# Patient Record
Sex: Female | Born: 2000 | Race: White | Hispanic: No | Marital: Single
Health system: Southern US, Community
[De-identification: ages and names within clinical notes are randomized; demographics above are authoritative.]

---
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
## 2024-05-07 ENCOUNTER — Ambulatory Visit: Admitting: Dermatology

## 2024-05-21 ENCOUNTER — Encounter: Payer: Self-pay | Admitting: Dermatology

## 2024-05-21 ENCOUNTER — Ambulatory Visit: Payer: PRIVATE HEALTH INSURANCE | Admitting: Dermatology

## 2024-05-21 DIAGNOSIS — L700 Acne vulgaris: Secondary | ICD-10-CM

## 2024-05-21 DIAGNOSIS — Z79899 Other long term (current) drug therapy: Secondary | ICD-10-CM

## 2024-05-21 NOTE — Patient Instructions (Addendum)
 Contact [facility] at [phone] to transfer care to Dr. Claudene so we can enroll you in [facility].  Due to recent changes in healthcare laws, you may see results of your pathology and/or laboratory studies on MyChart before the doctors have had a chance to review them. We understand that in some cases there may be results that are confusing or concerning to you. Please understand that not all results are received at the same time and often the doctors may need to interpret multiple results in order to provide you with the best plan of care or course of treatment. Therefore, we ask that you please give us  2 business days to thoroughly review all your results before contacting the office for clarification. Should we see a critical lab result, you will be contacted sooner.   If You Need Anything After Your Visit  If you have any questions or concerns for your doctor, please call our main line at [phone] and press option 4 to reach your doctor's medical assistant. If no one answers, please leave a voicemail as directed and we will return your call as soon as possible. Messages left after 4 pm will be answered the following business day.   You may also send us  a message via MyChart. We typically respond to MyChart messages within 1-2 business days.  For prescription refills, please ask your pharmacy to contact our office. Our fax number is [phone].  If you have an urgent issue when the clinic is closed that cannot wait until the next business day, you can page your doctor at the number below.    Please note that while we do our best to be available for urgent issues outside of office hours, we are not available 24/7.   If you have an urgent issue and are unable to reach us , you may choose to seek medical care at your doctor's office, retail clinic, urgent care center, or emergency room.  If you have a medical emergency, please immediately call 911 or go to the emergency department.  Pager  Numbers  - Dr. Hester: [phone]  - Dr. Jackquline: [phone]  - Dr. Claudene: [phone]   - Dr. Raymund: [phone]  In the event of inclement weather, please call our main line at [phone] for an update on the status of any delays or closures.  Dermatology Medication Tips: Please keep the boxes that topical medications come in in order to help keep track of the instructions about where and how to use these. Pharmacies typically print the medication instructions only on the boxes and not directly on the medication tubes.   If your medication is too expensive, please contact our office at [phone] option 4 or send us  a message through MyChart.   We are unable to tell what your co-pay for medications will be in advance as this is different depending on your insurance coverage. However, we may be able to find a substitute medication at lower cost or fill out paperwork to get insurance to cover a needed medication.   If a prior authorization is required to get your medication covered by your insurance company, please allow us  1-2 business days to complete this process.  Drug prices often vary depending on where the prescription is filled and some pharmacies may offer cheaper prices.  The website www.goodrx.com contains coupons for medications through different pharmacies. The prices here do not account for what the cost may be with help from insurance (it may be cheaper with your insurance), but the website  can give you the price if you did not use any insurance.  - You can print the associated coupon and take it with your prescription to the pharmacy.  - You may also stop by our office during regular business hours and pick up a GoodRx coupon card.  - If you need your prescription sent electronically to a different pharmacy, notify our office through [facility] or by phone at [phone] option 4.     Si Usted Necesita Algo Despus de Su Visita  Tambin puede  enviarnos un mensaje a travs de Clinical cytogeneticist. Por lo general respondemos a los mensajes de MyChart en el transcurso de 1 a 2 das hbiles.  Para renovar recetas, por favor pida a su farmacia que se ponga en contacto con nuestra oficina. Randi lakes de fax es [place] [phone].  Si tiene un asunto urgente cuando la clnica est cerrada y que no puede esperar hasta el siguiente da hbil, puede llamar/localizar a su doctor(a) al nmero que aparece a continuacin.   Por favor, tenga en cuenta que aunque hacemos todo lo posible para estar disponibles para asuntos urgentes fuera del horario de [place], no estamos disponibles las 24 horas del da, los 7 [address]  [address] de la [place].   Si tiene un problema urgente y no puede comunicarse con nosotros, puede optar por buscar atencin mdica  en el consultorio de su doctor(a), en una clnica privada, en un centro de atencin urgente o en una sala de emergencias.  Si tiene Engineer, drilling, por favor llame inmediatamente al 911 o vaya a la sala de emergencias.  Nmeros de bper  - Dr. Hester: [phone]  - Dra. Jackquline: [phone]  - Dr. Claudene: [phone]  - Dra. Kitts: [phone]  En caso de inclemencias del [employer], por favor llame a nuestra lnea principal al [phone] para una actualizacin sobre el estado de cualquier retraso o cierre.  Consejos para la medicacin en dermatologa: Por favor, guarde las cajas en las que vienen los medicamentos de uso tpico para ayudarle a seguir las instrucciones sobre dnde y cmo usarlos. Las farmacias generalmente imprimen las instrucciones del medicamento slo en las cajas y no directamente en los tubos del [place].   Si su medicamento es muy caro, por favor, pngase en contacto con landry rieger llamando al [phone] y presione la opcin 4 o envenos un mensaje a travs de Clinical cytogeneticist.   No podemos decirle cul ser su copago por los medicamentos por adelantado ya que esto es diferente dependiendo  de la cobertura de su seguro. Sin embargo, es posible que podamos encontrar un medicamento sustituto a Audiological scientist un formulario para que el seguro cubra el medicamento que se considera necesario.   Si se requiere una autorizacin previa para que su compaa de seguros malta su medicamento, por favor permtanos de 1 a 2 das hbiles para completar este proceso.  Los precios de los medicamentos varan con frecuencia dependiendo del Environmental consultant de dnde se surte la receta y alguna farmacias pueden ofrecer precios ms baratos.  El sitio web www.goodrx.com tiene cupones para medicamentos de Health and safety inspector. Los precios aqu no tienen en cuenta lo que podra costar con la ayuda del seguro (puede ser ms barato con su seguro), pero el sitio web puede darle el precio si no utiliz Tourist information centre manager.  - Puede imprimir el cupn correspondiente y llevarlo con su receta a la farmacia.  - Tambin puede pasar por nuestra oficina durante el horario de atencin regular y Librarian, academic tarjeta  de cupones de GoodRx.  - Si necesita que su receta se enve electrnicamente a una farmacia diferente, informe a nuestra oficina a travs de MyChart de [place] o por telfono llamando al [phone] y presione la opcin 4.

## 2024-05-21 NOTE — Progress Notes (Signed)
   New Patient Visit   Subjective  Chelsey Jenkins is a 23 y.o. female who presents for the following: Acne Vulgaris - pt was on Isotretinoin 2020 for 9 months, which cleared acne until 2022-2023, pt didn't have significant side effects other than dryness. Pt currently using Aklief lotion QHS and Winlevi cream BID, pt has been using for 4-5 mths. She was prescribed Minocycline, but it caused chronic headaches so she stopped using it. Doxycycline made pt extremely nauseous and she was unable to tolerated it.  The following portions of the chart were reviewed this encounter and updated as appropriate: medications, allergies, medical history  Review of Systems:  No other skin or systemic complaints except as noted in HPI or Assessment and Plan.  Objective  Well appearing patient in no apparent distress; mood and affect are within normal limits.  Areas Examined: Face, chest and back  Relevant exam findings are noted in the Assessment and Plan.   Assessment & Plan         ACNE VULGARIS   LONG-TERM USE OF HIGH-RISK MEDICATION   ACNE VULGARIS with scarring - S/P one course of Isotretinoin in 2020  Exam: scattered inflammatory papules on cheeks > chin > L shoulder. Pitted scars on cheeks temples glabella  Chronic and persistent condition with duration or expected duration over one year. Condition is bothersome/symptomatic for patient. Currently flared.  Treatment Plan: Discussed and recommend restarting isotretinoin since patient has inflammatory scarring acne. Pt states that she recently had labs drawn from her PCP and she will send those results to our office.   Pt currently on OBC and will plan to use condoms while on treatment.   In office urine pregnancy test negative today. Pt registered under another provider and was unable to login to [facility] today to transfer care. She will contact our office once she has transferred care to me so we can register her in  [facility].  Continue Aklief nightly and Winlevi BID. Deferring doxy given side effects  Discussed isotretinoin and potential side effects. Isotretinoin Counseling; Review and Contraception Counseling: Reviewed potential side effects of isotretinoin including xerosis, cheilitis, hepatitis, hyperlipidemia, and severe birth defects if taken by a pregnant woman.  Women on isotretinoin must be celibate (not having sex) or required to use at least 2 birth control methods to prevent pregnancy (unless patient is a female of non-child bearing potential).  Females of child-bearing potential must have monthly pregnancy tests while on isotretinoin and report through I-Pledge (FDA monitoring program). Reviewed reports of suicidal ideation in those with a history of depression while taking isotretinoin and reports of diagnosis of inflammatory bowl disease (IBD) while taking isotretinoin as well as the lack of evidence for a causal relationship between isotretinoin, depression and IBD. Patient advised to reach out with any questions or concerns. Patient advised not to share pills or donate blood while on treatment or for one month after completing treatment. All patient's considering Isotretinoin must read and understand and sign Isotretinoin Consent Form and be registered with I-Pledge.  Pt must be off of Isotretinoin for one year and face should be completely clear before starting treatment for scarring.  Return in about 1 month (around 06/21/2024) for Isotretinoin start.  LILLETTE Chelsey Jenkins, CMA, am acting as scribe for Chelsey Sharps, MD .   Documentation: I have reviewed the above documentation for accuracy and completeness, and I agree with the above.  Chelsey Sharps, MD

## 2024-05-22 ENCOUNTER — Encounter: Payer: Self-pay | Admitting: Dermatology

## 2024-07-21 ENCOUNTER — Other Ambulatory Visit: Payer: Self-pay

## 2024-07-21 ENCOUNTER — Emergency Department: Payer: PRIVATE HEALTH INSURANCE

## 2024-07-21 ENCOUNTER — Emergency Department
Admission: EM | Admit: 2024-07-21 | Discharge: 2024-07-21 | Disposition: A | Discharge status: Home / Self Care | Payer: PRIVATE HEALTH INSURANCE

## 2024-07-21 DIAGNOSIS — J4521 Mild intermittent asthma with (acute) exacerbation: Secondary | ICD-10-CM | POA: Insufficient documentation

## 2024-07-21 DIAGNOSIS — R079 Chest pain, unspecified: Secondary | ICD-10-CM | POA: Diagnosis present

## 2024-07-21 DIAGNOSIS — D72829 Elevated white blood cell count, unspecified: Secondary | ICD-10-CM | POA: Diagnosis not present

## 2024-07-21 LAB — BASIC METABOLIC PANEL WITH GFR
Anion gap: 10 (ref 5–15)
BUN: 13 mg/dL (ref 6–20)
CO2: 23 mmol/L (ref 22–32)
Calcium: 9.2 mg/dL (ref 8.9–10.3)
Chloride: 103 mmol/L (ref 98–111)
Creatinine, Ser: 0.8 mg/dL (ref 0.44–1.00)
GFR, Estimated: >60 mL/min (ref >60)
Glucose, Bld: 107 mg/dL — ABNORMAL HIGH (ref 70–99)
Potassium: 3.7 mmol/L (ref 3.5–5.1)
Sodium: 136 mmol/L (ref 135–145)

## 2024-07-21 LAB — CBC
HCT: 39.3 % (ref 36.0–46.0)
Hemoglobin: 13.3 g/dL (ref 12.0–15.0)
MCH: 29.8 pg (ref 26.0–34.0)
MCHC: 33.8 g/dL (ref 30.0–36.0)
MCV: 87.9 fL (ref 80.0–100.0)
Platelets: 286 K/uL (ref 150–400)
RBC: 4.47 MIL/uL (ref 3.87–5.11)
RDW: 12.2 % (ref 11.5–15.5)
WBC: 13.9 K/uL — ABNORMAL HIGH (ref 4.0–10.5)
nRBC: 0 % (ref 0.0–0.2)

## 2024-07-21 LAB — RESP PANEL BY RT-PCR (RSV, FLU A&B, COVID)  RVPGX2
Influenza A by PCR: NEGATIVE
Influenza B by PCR: NEGATIVE
Resp Syncytial Virus by PCR: NEGATIVE
SARS Coronavirus 2 by RT PCR: NEGATIVE

## 2024-07-21 LAB — POC URINE PREG, ED: Preg Test, Ur: NEGATIVE

## 2024-07-21 LAB — D-DIMER, QUANTITATIVE: D-Dimer, Quant: 0.42 ug{FEU}/mL (ref 0.00–0.50)

## 2024-07-21 LAB — TROPONIN I (HIGH SENSITIVITY): Troponin I (High Sensitivity): <2 ng/L (ref <18)

## 2024-07-21 MED ORDER — PREDNISONE 20 MG PO TABS: 20.0000 mg | ORAL_TABLET | Freq: Two times a day (BID) | ORAL | 0 refills | Status: DC

## 2024-07-21 MED ORDER — SODIUM CHLORIDE 0.9 % IV BOLUS
1000.0000 mL | Freq: Once | INTRAVENOUS | Status: AC
  Administered 2024-07-21: 1000 mL via INTRAVENOUS

## 2024-07-21 MED ORDER — IPRATROPIUM-ALBUTEROL 0.5-2.5 (3) MG/3ML IN SOLN
3.0000 mL | Freq: Once | RESPIRATORY_TRACT | Status: AC
  Administered 2024-07-21: 3 mL via RESPIRATORY_TRACT
  Filled 2024-07-21: qty 3

## 2024-07-21 MED ORDER — PREDNISONE 20 MG PO TABS
60.0000 mg | ORAL_TABLET | Freq: Once | ORAL | Status: AC
  Administered 2024-07-21: 60 mg via ORAL
  Filled 2024-07-21: qty 3

## 2024-07-21 MED ORDER — PREDNISONE 20 MG PO TABS: 20.0000 mg | ORAL_TABLET | Freq: Two times a day (BID) | ORAL | 0 refills | Status: AC

## 2024-07-21 NOTE — Discharge Instructions (Signed)
 For the next 24 hours use your inhaler or breathing treatments every 4-6 hours while awake.  Being in a humidified environment will help.  Your next dose of steroids will be due tomorrow morning.  Please return with any acutely worsening symptoms or any other emergency. -- RETURN PRECAUTIONS & AFTERCARE: (ENGLISH) RETURN PRECAUTIONS: Return immediately to the emergency department or see/call your doctor if you feel worse, weak or have changes in speech or vision, are short of breath, have fever, vomiting, pain, bleeding or dark stool, trouble urinating or any new issues. Return here or see/call your doctor if not improving as expected for your suspected condition. FOLLOW-UP CARE: Call your doctor and/or any doctors we referred you to for more advice and to make an appointment. Do this today, tomorrow or after the weekend. Some doctors only take PPO insurance so if you have HMO insurance you may want to contact your HMO or your regular doctor for referral to a specialist within your plan. Either way tell the doctor's office that it was a referral from the emergency department so you get the soonest possible appointment.  YOUR TEST RESULTS: Take result reports of any blood or urine tests, imaging tests and EKG's to your doctor and any referral doctor. Have any abnormal tests repeated. Your doctor or a referral doctor can let you know when this should be done. Also make sure your doctor contacts this hospital to get any test results that are not currently available such as cultures or special tests for infection and final imaging reports, which are often not available at the time you leave the ER but which may list additional important findings that are not documented on the preliminary report. BLOOD PRESSURE: If your blood pressure was greater than 120/80 have your blood pressure rechecked within 1 to 2 weeks. MEDICATION SIDE EFFECTS: Do not drive, walk, bike, take the bus, etc. if you have received or are  being prescribed any sedating medications such as those for pain or anxiety or certain antihistamines like Benadryl. If you have been give one of these here get a taxi home or have a friend drive you home. Ask your pharmacist to counsel you on potential side effects of any new medication

## 2024-07-21 NOTE — ED Provider Notes (Signed)
 [facility] Provider Note    Event Date/Time   First MD Initiated Contact with Patient 07/21/24 1851     (approximate)   History   Chest Pain   HPI  Chelsey Jenkins is a 23 y.o. female with a past medical history of asthma who presents to the emergency department with 1 week of worsening cough and mild congestion and 1 day of chest pain with exertion.  Patient reports that her cough is productive and was not dissipating despite a dose of Mucinex.  She has not used a breathing treatment in some time.  She has chest pain especially with deep breaths and with exertion.  She is on oral contraceptives.  She has not had any fevers.  She presents with her mother who contributes to the history      Physical Exam   Triage Vital Signs: ED Triage Vitals  Encounter Vitals Group     BP 07/21/24 1829 (!) 131/90     Girls Systolic BP Percentile --      Girls Diastolic BP Percentile --      Boys Systolic BP Percentile --      Boys Diastolic BP Percentile --      Pulse Rate 07/21/24 1829 (!) 115     Resp 07/21/24 1829 (!) 22     Temp 07/21/24 1829 98.5 F (36.9 C)     Temp Source 07/21/24 1829 Oral     SpO2 07/21/24 1829 98 %     Weight 07/21/24 1830 130 lb (59 kg)     Height 07/21/24 1830 5' 5 (1.651 m)     Head Circumference --      Peak Flow --      Pain Score 07/21/24 1830 0     Pain Loc --      Pain Education --      Exclude from Growth Chart --     Most recent vital signs: Vitals:   07/21/24 2100 07/21/24 2124  BP: 107/74   Pulse: (!) 103   Resp: (!) 21   Temp:  98.3 F (36.8 C)  SpO2: 99%     Nursing Triage Note reviewed. Vital signs reviewed and patients oxygen saturation is normoxic  General: Patient is well nourished, well developed, awake and alert, resting comfortably in no acute distress Head: Normocephalic and atraumatic Eyes: Normal inspection, extraocular muscles intact, no conjunctival pallor Ear, nose, throat: Normal external  exam Neck: Normal range of motion Respiratory: Patient is in no respiratory distress, lungs with wheezes in the apices Cardiovascular: Patient is tachycardic, RR without murmur appreciated GI: Abd SNT with no guarding or rebound  Back: Normal inspection of the back with good strength and range of motion throughout all ext Extremities: pulses intact with good cap refills, no LE pitting edema or calf tenderness Neuro: The patient is alert and oriented to person, place, and time, appropriately conversive, with 5/5 bilat UE/LE strength, no gross motor or sensory defects noted. Coordination appears to be adequate. Skin: Warm, dry, and intact Psych: normal mood and affect, no SI or HI  ED Results / Procedures / Treatments   Labs (all labs ordered are listed, but only abnormal results are displayed) Labs Reviewed  BASIC METABOLIC PANEL WITH GFR - Abnormal; Notable for the following components:      Result Value   Glucose, Bld 107 (*)    All other components within normal limits  CBC - Abnormal; Notable for the following components:   WBC  13.9 (*)    All other components within normal limits  RESP PANEL BY RT-PCR (RSV, FLU A&B, COVID)  RVPGX2  D-DIMER, QUANTITATIVE  POC URINE PREG, ED  TROPONIN I (HIGH SENSITIVITY)  TROPONIN I (HIGH SENSITIVITY)     EKG EKG and rhythm strip are interpreted by myself:   EKG: [Tachycardic sinus rhythm] at heart rate of 110, normal QRS duration, QTc 400, normal or nonspecific ST segments and T waves no ectopy EKG not consistent with Acute STEMI Rhythm strip: Tachycardic in lead II   RADIOLOGY Chest x-ray: No acute abnormality on my independent review interpretation    PROCEDURES:  Critical Care performed: No  Procedures   MEDICATIONS ORDERED IN ED: Medications  sodium chloride 0.9 % bolus 1,000 mL (0 mLs Intravenous Stopped 07/21/24 2012)  ipratropium-albuterol (DUONEB) 0.5-2.5 (3) MG/3ML nebulizer solution 3 mL (3 mLs Nebulization Given  07/21/24 2009)  predniSONE (DELTASONE) tablet 60 mg (60 mg Oral Given 07/21/24 2008)     IMPRESSION / MDM / ASSESSMENT AND PLAN / ED COURSE                                Differential diagnosis includes, but is not limited to, asthma exacerbation, pneumonia, PE, atypical ACS, URI, pregnancy, electrolyte derangement anemia   ED course: Patient is extremely well-appearing and satting well on room air.  Pulmonary exam consistent with slight wheezing which dissipated after a breathing treatment and patient felt improved.  Will follow with 60 mg of prednisone.  Respiratory PCR negative for COVID or influenza.  She was not negative.  D-dimer was sent given her tachycardia and OCP use for consideration of pulmonary embolism however this was not elevated.  Despite having more than 12 hours of chest pain, high-sensitivity troponin was not elevated and patient's heart score is 1.  Patient feels comfortable returning home and all questions answered.  I will send her home with a short course prednisone and patient has a nebulizer at home and will use it for the next 24 hours.   Clinical Course as of 07/22/24 0002  Sun Jul 21, 2024  8069 Troponin I (High Sensitivity): <2 Not elevated [HD]  1931 DG Chest 2 View No acute process [HD]  2007 D-Dimer, Quant: 0.42 Not elevated [HD]  2039 Repeat exam without any pulmonary wheezes.  Patient feels improved.  Will send her home with 5 days of prednisone she has nebulizers.  She will follow-up with her primary care physician. [HD]  2040 WBC(!): 13.9 Slight leukocytosis [HD]  2040 Sodium: 136 No electrolyte derangements [HD]    Clinical Course User Index [HD] Nicholaus Rolland BRAVO, MD   At time of discharge there is no evidence of acute life, limb, vision, or fertility threat. Patient has stable vital signs, pain is well controlled, patient is ambulatory and p.o. tolerant.  Discharge instructions were completed using the EPIC system. I would refer you to those  at this time. All warnings prescriptions follow-up etc. were discussed in detail with the patient. Patient indicates understanding and is agreeable with this plan. All questions answered.  Patient is made aware that they may return to the emergency department for any worsening or new condition or for any other emergency.  -- Risk: 5 This patient has a high risk of morbidity due to further diagnostic testing or treatment. Rationale: This patient's evaluation and management involve a high risk of morbidity due to the potential severity of presenting  symptoms, need for diagnostic testing, and/or initiation of treatment that may require close monitoring. The differential includes conditions with potential for significant deterioration or requiring escalation of care. Treatment decisions in the ED, including medication administration, procedural interventions, or disposition planning, reflect this level of risk. COPA: 5 The patient has the following acute or chronic illness/injury that poses a possible threat to life or bodily function: [X] : The patient has a potentially serious acute condition or an acute exacerbation of a chronic illness requiring urgent evaluation and management in the Emergency Department. The clinical presentation necessitates immediate consideration of life-threatening or function-threatening diagnoses, even if they are ultimately ruled out.   FINAL CLINICAL IMPRESSION(S) / ED DIAGNOSES   Final diagnoses:  Mild intermittent asthma with exacerbation     Rx / DC Orders   ED Discharge Orders          Ordered    predniSONE (DELTASONE) 20 MG tablet  2 times daily with meals,   Status:  Discontinued        07/21/24 2042    predniSONE (DELTASONE) 20 MG tablet  2 times daily with meals        07/21/24 2122             Note:  This document was prepared using Dragon voice recognition software and may include unintentional dictation errors.   Nicholaus Rolland BRAVO, MD 07/22/24  [phone]

## 2024-07-21 NOTE — ED Triage Notes (Signed)
 Pt to ed from home via POV for CP from exertion and coughing. Pt is caox4, in no acute distress and ambulatory to triage. Pt has productive cough and no fever. Pt has been taking mucinex once with no relief.
